# Patient Record
Sex: Female | Born: 1990 | Hispanic: Yes | Marital: Single
Health system: Northeastern US, Academic
[De-identification: ages and names within clinical notes are randomized; demographics above are authoritative.]

## PROBLEM LIST (undated history)

## (undated) DIAGNOSIS — E119 Type 2 diabetes mellitus without complications: Secondary | ICD-10-CM

## (undated) DIAGNOSIS — T148XXA Other injury of unspecified body region, initial encounter: Secondary | ICD-10-CM

## (undated) HISTORY — PX: SKIN GRAFT: SHX250

## (undated) HISTORY — PX: FEMUR SURGERY: SHX943

---

## 2017-05-15 ENCOUNTER — Encounter (HOSPITAL_COMMUNITY): Payer: Self-pay

## 2017-05-15 ENCOUNTER — Emergency Department (HOSPITAL_COMMUNITY)
Admission: EM | Admit: 2017-05-15 | Discharge: 2017-05-15 | Disposition: A | Discharge status: Home / Self Care | Payer: Medicare Other | Attending: Emergency Medicine | Admitting: Emergency Medicine

## 2017-05-15 DIAGNOSIS — N730 Acute parametritis and pelvic cellulitis: Secondary | ICD-10-CM | POA: Insufficient documentation

## 2017-05-15 DIAGNOSIS — N898 Other specified noninflammatory disorders of vagina: Secondary | ICD-10-CM | POA: Insufficient documentation

## 2017-05-15 DIAGNOSIS — E119 Type 2 diabetes mellitus without complications: Secondary | ICD-10-CM | POA: Insufficient documentation

## 2017-05-15 DIAGNOSIS — R102 Pelvic and perineal pain: Secondary | ICD-10-CM | POA: Diagnosis present

## 2017-05-15 HISTORY — DX: Type 2 diabetes mellitus without complications: E11.9

## 2017-05-15 HISTORY — DX: Other injury of unspecified body region, initial encounter: T14.8XXA

## 2017-05-15 LAB — CBC WITH DIFFERENTIAL/PLATELET
BASOS ABS: 0 10*3/uL (ref 0.0–0.1)
Basophils Relative: 0 %
Eosinophils Absolute: 0 10*3/uL (ref 0.0–0.7)
Eosinophils Relative: 0 %
HEMATOCRIT: 38.1 % (ref 36.0–46.0)
Hemoglobin: 12.8 g/dL (ref 12.0–15.0)
LYMPHS PCT: 16 %
Lymphs Abs: 1.4 10*3/uL (ref 0.7–4.0)
MCH: 29.4 pg (ref 26.0–34.0)
MCHC: 33.6 g/dL (ref 30.0–36.0)
MCV: 87.6 fL (ref 78.0–100.0)
MONO ABS: 0.5 10*3/uL (ref 0.1–1.0)
MONOS PCT: 5 %
NEUTROS ABS: 6.8 10*3/uL (ref 1.7–7.7)
Neutrophils Relative %: 79 %
Platelets: 241 10*3/uL (ref 150–400)
RBC: 4.35 MIL/uL (ref 3.87–5.11)
RDW: 12.9 % (ref 11.5–15.5)
WBC: 8.7 10*3/uL (ref 4.0–10.5)

## 2017-05-15 LAB — WET PREP, GENITAL
Sperm: NONE SEEN
Trich, Wet Prep: NONE SEEN
YEAST WET PREP: NONE SEEN

## 2017-05-15 LAB — URINALYSIS, ROUTINE W REFLEX MICROSCOPIC
BACTERIA UA: NONE SEEN
BILIRUBIN URINE: NEGATIVE
Glucose, UA: NEGATIVE mg/dL
KETONES UR: NEGATIVE mg/dL
Nitrite: NEGATIVE
PROTEIN: NEGATIVE mg/dL
Specific Gravity, Urine: 1.018 (ref 1.005–1.030)
pH: 5 (ref 5.0–8.0)

## 2017-05-15 LAB — PREGNANCY, URINE: PREG TEST UR: NEGATIVE

## 2017-05-15 MED ORDER — AZITHROMYCIN 250 MG PO TABS
1000.0000 mg | ORAL_TABLET | Freq: Once | ORAL | Status: AC
  Administered 2017-05-15: 1000 mg via ORAL
  Filled 2017-05-15: qty 4

## 2017-05-15 MED ORDER — CEFTRIAXONE SODIUM 1 G IJ SOLR
1.0000 g | Freq: Once | INTRAMUSCULAR | Status: AC
  Administered 2017-05-15: 1 g via INTRAMUSCULAR
  Filled 2017-05-15: qty 10

## 2017-05-15 MED ORDER — STERILE WATER FOR INJECTION IJ SOLN
INTRAMUSCULAR | Status: AC
  Filled 2017-05-15: qty 10

## 2017-05-15 MED ORDER — NAPROXEN 500 MG PO TABS: 500.0000 mg | ORAL_TABLET | Freq: Two times a day (BID) | ORAL | 0 refills | Status: AC

## 2017-05-15 MED ORDER — METRONIDAZOLE 500 MG PO TABS: 500.0000 mg | ORAL_TABLET | Freq: Two times a day (BID) | ORAL | 0 refills | Status: AC

## 2017-05-15 NOTE — ED Provider Notes (Signed)
[facility]-EMERGENCY DEPT Provider Note   CSN: [id number] Arrival date & time: 05/15/17  1819   By signing my name below, I, Kristina Tate, attest that this documentation has been prepared under the direction and in the presence of Kerrie BuffaloHope Laiklyn Pilkenton, NP Electronically Signed: Soijett Tate, ED Scribe. 05/15/17. 8:43 PM.  History   Chief Complaint Chief Complaint  Patient presents with  . Pelvic Pain    HPI Kristina Tate is a 26 y.o. female with a PMHx of DM, who presents to the Emergency Department complaining of pelvic pain onset today. Pt reports associated yellow vaginal discharge, chills, nausea, and loose stools. Pt has not tried any medications for the relief of her symptoms. She notes that she had "rough sex" 5 days ago prior to the onset of her symptoms. Patient's last menstrual period was 05/08/2017 which lasted for ~4 days. She notes that following the completion of her menstrual cycle, she had sexual intercourse with vaginal bleeding noted afterwards. She notes that she has one sexual partner of 2 months with whom she has unprotected sex. Pt denies her sexual partner complaining of any genital symptoms. She reports that she has had 3 miscarriages with 1 living child. She denies fever, vomiting, dysuria, urinary frequency, and any other symptoms. Pt is allergic to morphine and penicillin. Pt states that she doesn't take any medications for her DM and is diet-controlled at this time.       The history is provided by the patient. No language interpreter was used.    Past Medical History:  Diagnosis Date  . Diabetes mellitus without complication (HCC)   . Nerve damage     There are no active problems to display for this patient.   Past Surgical History:  Procedure Laterality Date  . FEMUR SURGERY    . SKIN GRAFT      OB History    No data available       Home Medications    Prior to Admission medications   Medication Sig Start Date End Date Taking? Authorizing Provider    metroNIDAZOLE (FLAGYL) 500 MG tablet Take 1 tablet (500 mg total) by mouth 2 (two) times daily. 05/15/17   Janne NapoleonNeese, Kawanda Drumheller M, NP    Family History No family history on file.  Social History Social History  Substance Use Topics  . Smoking status: Never Smoker  . Smokeless tobacco: Never Used  . Alcohol use Yes     Allergies   Morphine and related and Penicillins   Review of Systems Review of Systems  Constitutional: Positive for chills. Negative for fever.  Gastrointestinal: Positive for nausea. Negative for vomiting.  Genitourinary: Positive for pelvic pain and vaginal discharge (yellow). Negative for dysuria and frequency.     Physical Exam Updated Vital Signs BP 117/60 (BP Location: Left Arm)   Pulse 89   Temp 100.1 F (37.8 C) (Oral)   Resp 18   LMP 05/08/2017   SpO2 100%   Physical Exam  Constitutional: No distress.  obese  HENT:  Head: Normocephalic and atraumatic.  Eyes: EOM are normal.  Neck: Neck supple.  Cardiovascular: Normal rate and regular rhythm.   Pulmonary/Chest: Effort normal and breath sounds normal.  Abdominal: Soft. Bowel sounds are normal. There is tenderness. There is no CVA tenderness.  Lower abdominal tender with palpation. No guarding or rebound. No CVA tenderness.   Genitourinary:  Genitourinary Comments: Nurse chaperone present for exam. External genitalia without lesions. Purulent yellow d/c vaginal vault, cervix inflamed, positive CMT, bilateral adnexal  tenderness, no mass palpated.   Musculoskeletal: Normal range of motion.  Neurological: She is alert.  Skin: Skin is warm and dry.  Psychiatric: She has a normal mood and affect. Her behavior is normal.  Nursing note and vitals reviewed.   ED Treatments / Results  DIAGNOSTIC STUDIES: Oxygen Saturation is 100% on RA, nl by my interpretation.    COORDINATION OF CARE: 7:27 PM Discussed treatment plan with pt at bedside and pt agreed to plan.   Labs (all labs ordered are listed,  but only abnormal results are displayed) Labs Reviewed  WET PREP, GENITAL - Abnormal; Notable for the following:       Result Value   Clue Cells Wet Prep HPF POC PRESENT (*)    WBC, Wet Prep HPF POC MANY (*)    All other components within normal limits  URINALYSIS, ROUTINE W REFLEX MICROSCOPIC - Abnormal; Notable for the following:    Hgb urine dipstick MODERATE (*)    Leukocytes, UA TRACE (*)    Squamous Epithelial / LPF 0-5 (*)    All other components within normal limits  PREGNANCY, URINE  CBC WITH DIFFERENTIAL/PLATELET  RPR  HIV ANTIBODY (ROUTINE TESTING)  GC/CHLAMYDIA PROBE AMP ([place]) NOT AT [facility]   Radiology No results found.  Procedures Procedures (including critical care time)  Medications Ordered in ED Medications  cefTRIAXone (ROCEPHIN) injection 1 g (not administered)  azithromycin (ZITHROMAX) tablet 1,000 mg (not administered)     Initial Impression / Assessment and Plan / ED Course  I have reviewed the triage vital signs and the nursing notes.  Pertinent lab results that were available during my care of the patient were reviewed by me and considered in my medical decision making (see chart for details).   Final Clinical Impressions(s) / ED Diagnoses  26 y.o. female with pelvic pain, low grade fever and vaginal d/c. Patient's partner here with penile d/c and dysuria. Patient has normal CBC, no concern at this time for appendicitis. She does not have a surgical abdomen. Will treat for PID and she is to f/u with the health department. Rocephin 1 gram IM, Zithromax 1 gram PO and Rx Flagyl. Discussed with the patient findings and plan of care. She voices understanding.  Final diagnoses:  PID (acute pelvic inflammatory disease)    New Prescriptions New Prescriptions   METRONIDAZOLE (FLAGYL) 500 MG TABLET    Take 1 tablet (500 mg total) by mouth 2 (two) times daily.   I personally performed the services described in this documentation, which was scribed in  my presence. The recorded information has been reviewed and is accurate.    Kerrie Buffalo [place], [place] 05/15/17 2051    Geoffery Lyons, MD 05/15/17 2358

## 2017-05-15 NOTE — Discharge Instructions (Signed)
No sex for 2 weeks. Follow up with the health department for further testing.
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
## 2017-05-15 NOTE — ED Notes (Signed)
Pt understood dc material. NAD noted. Scripts given at dc 

## 2017-05-16 LAB — RPR: RPR Ser Ql: NONREACTIVE

## 2017-05-16 LAB — HIV ANTIBODY (ROUTINE TESTING W REFLEX): HIV Screen 4th Generation wRfx: NONREACTIVE

## 2017-05-18 LAB — GC/CHLAMYDIA PROBE AMP (~~LOC~~) NOT AT ARMC
CHLAMYDIA, DNA PROBE: NEGATIVE
NEISSERIA GONORRHEA: POSITIVE — AB

## 2018-01-11 ENCOUNTER — Emergency Department (HOSPITAL_COMMUNITY)
Admission: EM | Admit: 2018-01-11 | Discharge: 2018-01-11 | Disposition: A | Discharge status: Home / Self Care | Payer: Medicare Other | Attending: Emergency Medicine | Admitting: Emergency Medicine

## 2018-01-11 ENCOUNTER — Emergency Department (HOSPITAL_COMMUNITY): Payer: Medicare Other

## 2018-01-11 ENCOUNTER — Encounter (HOSPITAL_COMMUNITY): Payer: Self-pay

## 2018-01-11 ENCOUNTER — Other Ambulatory Visit: Payer: Self-pay

## 2018-01-11 DIAGNOSIS — Z79899 Other long term (current) drug therapy: Secondary | ICD-10-CM | POA: Insufficient documentation

## 2018-01-11 DIAGNOSIS — E119 Type 2 diabetes mellitus without complications: Secondary | ICD-10-CM | POA: Insufficient documentation

## 2018-01-11 DIAGNOSIS — M25561 Pain in right knee: Secondary | ICD-10-CM

## 2018-01-11 DIAGNOSIS — M25562 Pain in left knee: Secondary | ICD-10-CM | POA: Insufficient documentation
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
## 2018-01-11 NOTE — ED Provider Notes (Signed)
[facility] EMERGENCY DEPARTMENT Provider Note   CSN: [id number] Arrival date & time: 01/11/18  1434     History   Chief Complaint Chief Complaint  Patient presents with  . Knee Pain    HPI Kristina Tate is a 27 y.o. female.  HPI   27 year old female presents today with complaints of left knee pain.  Patient has a significant past medical history of trauma to her left knee.  She reports she has hardware in the left knee.  She notes that occasionally she has flares of pain in both the hip and the knee.  She notes that yesterday started having pain left hip and left knee.  She reports she is unable to extend the left knee typical of previous episodes.  She is able to ambulate.  She denies any redness warmth or fever.  Denies any neurological deficits.  Patient reports she does not like taking medication for pain and did not take anything prior today.   Past Medical History:  Diagnosis Date  . Diabetes mellitus without complication (HCC)   . Nerve damage     There are no active problems to display for this patient.   Past Surgical History:  Procedure Laterality Date  . FEMUR SURGERY    . SKIN GRAFT      OB History    No data available       Home Medications    Prior to Admission medications   Medication Sig Start Date End Date Taking? Authorizing Provider  metroNIDAZOLE (FLAGYL) 500 MG tablet Take 1 tablet (500 mg total) by mouth 2 (two) times daily. 05/15/17   Janne Napoleon, NP  naproxen (NAPROSYN) 500 MG tablet Take 1 tablet (500 mg total) by mouth 2 (two) times daily. 05/15/17   Janne Napoleon, NP    Family History No family history on file.  Social History Social History   Tobacco Use  . Smoking status: Never Smoker  . Smokeless tobacco: Never Used  Substance Use Topics  . Alcohol use: Yes  . Drug use: Yes     Allergies   Morphine and related and Penicillins   Review of Systems Review of Systems  All other systems reviewed  and are negative.   Physical Exam Updated Vital Signs BP 99/68 (BP Location: Right Arm)   Pulse 78   Temp 99 F (37.2 C) (Oral)   Resp 19   Wt 95.3 kg (210 lb)   SpO2 99%   Physical Exam  Constitutional: She is oriented to person, place, and time. She appears well-developed and well-nourished.  HENT:  Head: Normocephalic and atraumatic.  Eyes: Conjunctivae are normal. Pupils are equal, round, and reactive to light. Right eye exhibits no discharge. Left eye exhibits no discharge. No scleral icterus.  Neck: Normal range of motion. No JVD present. No tracheal deviation present.  Pulmonary/Chest: Effort normal. No stridor.  Musculoskeletal:  Left knee slightly flexed no warmth or redness-nontender to palpation.  Pain with extension  Neurological: She is alert and oriented to person, place, and time. Coordination normal.  Psychiatric: She has a normal mood and affect. Her behavior is normal. Judgment and thought content normal.  Nursing note and vitals reviewed.   ED Treatments / Results  Labs (all labs ordered are listed, but only abnormal results are displayed) Labs Reviewed - No data to display  EKG  EKG Interpretation None       Radiology Dg Knee Complete 4 Views Left  Result Date: 01/11/2018  CLINICAL DATA:  Progressive knee pain over the last 24 hours. No recent traumatic injury. EXAM: LEFT KNEE - COMPLETE 4+ VIEW COMPARISON:  None. FINDINGS: Distal tibial IM nail with interlocking screws noted. Small posterior superficial vascular clips along the calf. Lucency associated with the tip of the IM nail extends about 1.5 cm past the tip of the nail. No appreciable fracture. The lateral view attempt is off axis reducing sensitivity for knee effusion, but no definite effusion is seen. Minimal prepatellar subcutaneous edema. IMPRESSION: 1. No specific abnormality to explain the patient's knee pain. If symptoms are refractory to conservative therapy, further imaging by MRI or CT may  be warranted. 2. There is some lucency around the tip of the distal femoral IM nail, but this may well be chronic and related to original placement as I do not see lucency tracking around the margins of the distal IM nail to further suggest loosening or infection. Electronically Signed   By: Gaylyn RongWalter  Liebkemann M.D.   On: 01/11/2018 17:44    Procedures Procedures (including critical care time)  Medications Ordered in ED Medications - No data to display   Initial Impression / Assessment and Plan / ED Course  I have reviewed the triage vital signs and the nursing notes.  Pertinent labs & imaging results that were available during my care of the patient were reviewed by me and considered in my medical decision making (see chart for details).      Final Clinical Impressions(s) / ED Diagnoses   Final diagnoses:  Acute pain of right knee    Labs:   Imaging: DG knee complete left  Consults:  Therapeutics:  Discharge Meds:   Assessment/Plan: 27 year old female presents today with pain in her left knee typical of pain flares.  No signs of infection, no significant changes on her plain films of her knee.  Patient encouraged to rest, use ibuprofen or Tylenol, follow-up with orthopedics.  Return precautions given.  She verbalized understanding and agreement to today's plan.   ED Discharge Orders    None       Rosalio LoudHedges, Hannibal Skalla, PA-C 01/11/18 1807    Azalia Bilisampos, Kevin, MD 01/11/18 [phone]

## 2018-01-11 NOTE — Discharge Instructions (Signed)
Please read attached information. If you experience any new or worsening signs or symptoms please return to the emergency room for evaluation. Please follow-up with your primary care provider or specialist as discussed.  °

## 2018-06-25 IMAGING — DX DG KNEE COMPLETE 4+V*L*
4 series · 4 of 4 positions shown · non-contrast
Comparison: None.

CLINICAL DATA: Progressive knee pain over the last 24 hours. No
recent traumatic injury.

EXAM:
LEFT KNEE - COMPLETE 4+ VIEW

[knee ap]
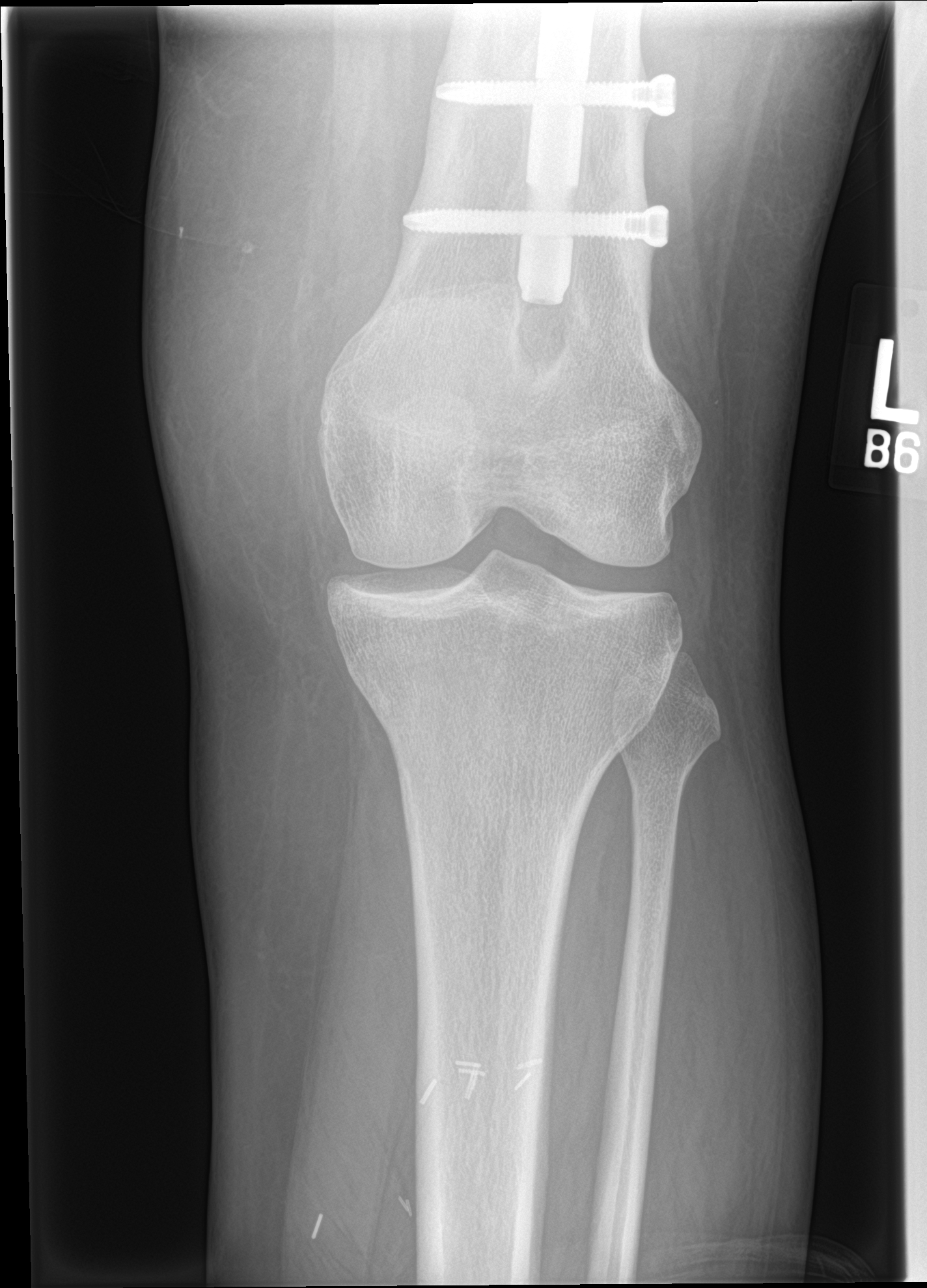

[knee lat]
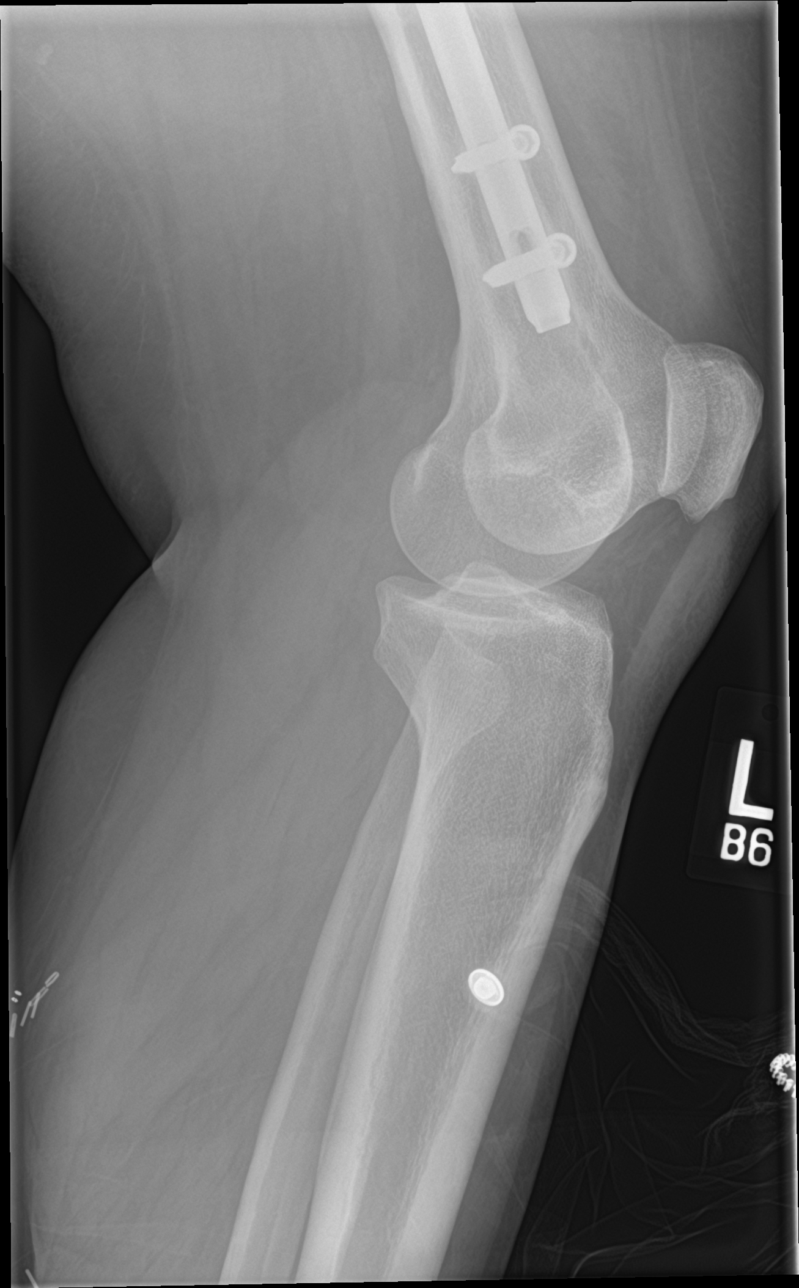

[knee obl (1 of 2)]
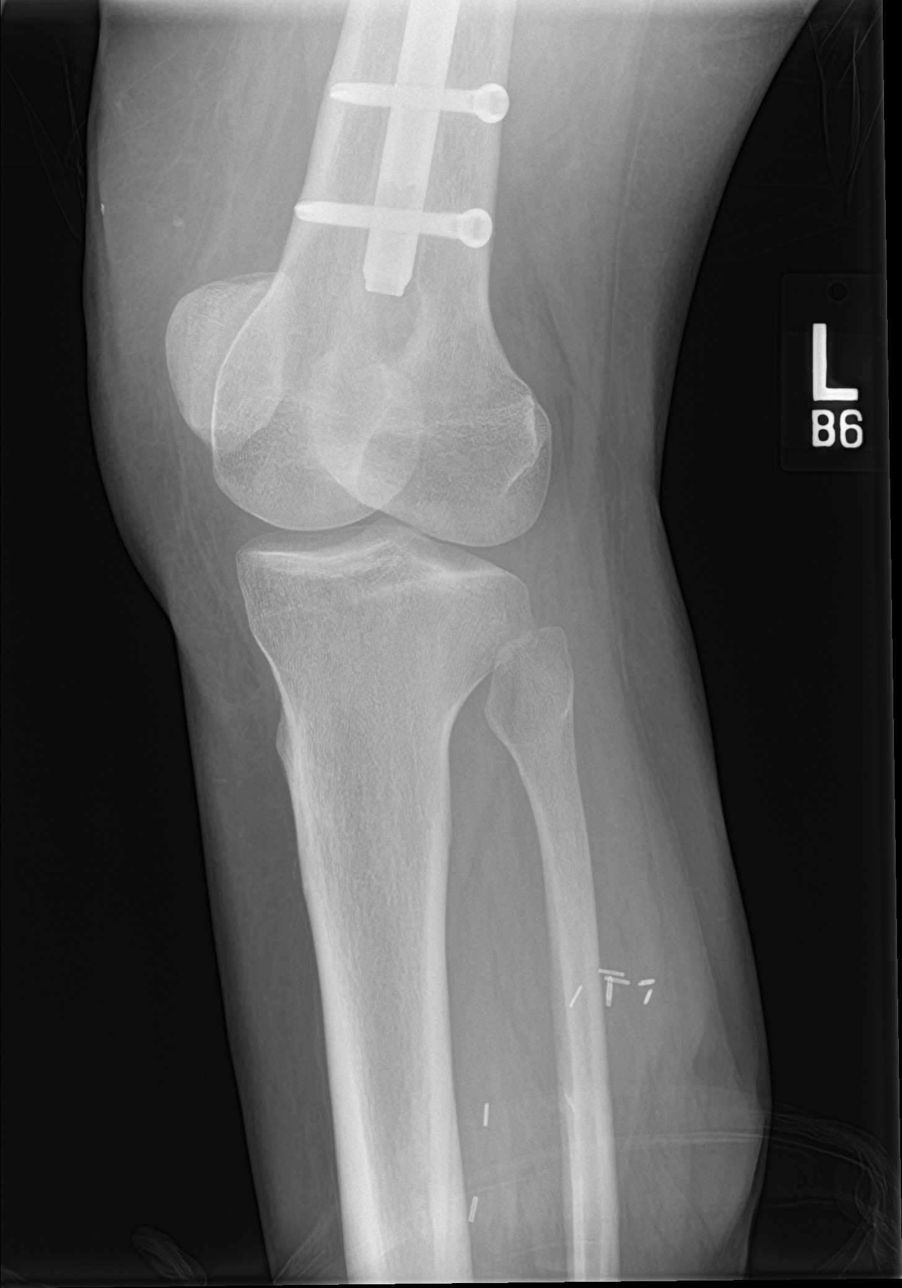

[knee obl (2 of 2)]
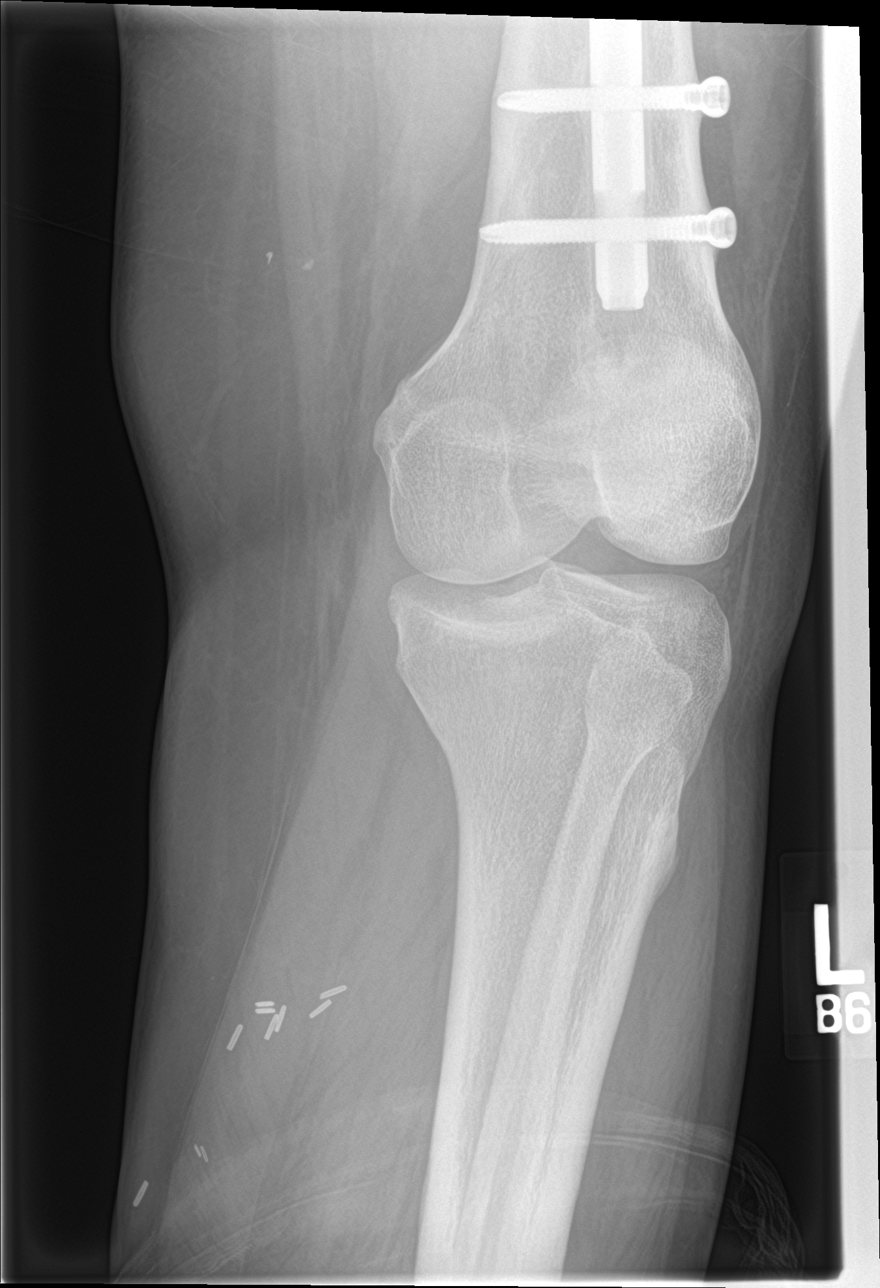

[4 of 4 positions shown; findings below may reference images not displayed]

FINDINGS: Distal tibial IM nail with interlocking screws noted. Small
posterior superficial vascular clips along the calf.

Lucency associated with the tip of the IM nail extends about 1.5 cm
past the tip of the nail.

No appreciable fracture. The lateral view attempt is off axis
reducing sensitivity for knee effusion, but no definite effusion is
seen. Minimal prepatellar subcutaneous edema.
IMPRESSION: 1. No specific abnormality to explain the patient's knee pain. If
symptoms are refractory to conservative therapy, further imaging by
MRI or CT may be warranted.
2. There is some lucency around the tip of the distal femoral IM
nail, but this may well be chronic and related to original placement
as I do not see lucency tracking around the margins of the distal IM
nail to further suggest loosening or infection.

## 2019-12-15 ENCOUNTER — Encounter: Admit: 2019-12-15 | Payer: PRIVATE HEALTH INSURANCE | Attending: Family

## 2019-12-15 DIAGNOSIS — M2559 Pain in other joint: Secondary | ICD-10-CM

## 2019-12-20 ENCOUNTER — Ambulatory Visit: Admit: 2019-12-20 | Payer: MEDICAID | Attending: Neurology

## 2019-12-20 ENCOUNTER — Inpatient Hospital Stay: Admit: 2019-12-20 | Discharge: 2019-12-20 | Payer: MEDICAID

## 2019-12-20 ENCOUNTER — Encounter: Admit: 2019-12-20 | Payer: PRIVATE HEALTH INSURANCE | Attending: Neurology

## 2019-12-20 DIAGNOSIS — R413 Other amnesia: Secondary | ICD-10-CM

## 2019-12-20 DIAGNOSIS — Z87820 Personal history of traumatic brain injury: Secondary | ICD-10-CM

## 2019-12-20 DIAGNOSIS — M79672 Pain in left foot: Secondary | ICD-10-CM

## 2019-12-20 DIAGNOSIS — R209 Unspecified disturbances of skin sensation: Secondary | ICD-10-CM

## 2019-12-20 DIAGNOSIS — M79652 Pain in left thigh: Secondary | ICD-10-CM

## 2019-12-20 DIAGNOSIS — M25552 Pain in left hip: Secondary | ICD-10-CM

## 2019-12-20 DIAGNOSIS — M25572 Pain in left ankle and joints of left foot: Secondary | ICD-10-CM

## 2019-12-20 LAB — VITAMIN B12: BKR VITAMIN B12: 473 pg/mL (ref 232–1245)

## 2019-12-20 LAB — TSH: BKR THYROID STIMULATING HORMONE: 1.53 u[IU]/mL

## 2019-12-20 LAB — FOLATE: BKR FOLATE: 7.3 ng/mL

## 2019-12-20 MED ORDER — GABAPENTIN 300 MG CAPSULE
300 mg | ORAL_CAPSULE | Freq: Three times a day (TID) | ORAL | 3 refills | Status: AC
Start: 2019-12-20 — End: ?

## 2019-12-20 NOTE — Patient Instructions
-   please call orthopedic surgery to set up follow up for left hip pain- you will be referred to plastic surgery to address your concerns with the left foot- you will be referred to our Concussion Clinic to evaluate memory impairment
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
## 2019-12-23 ENCOUNTER — Encounter: Admit: 2019-12-23 | Payer: MEDICAID | Attending: Psychiatry

## 2020-01-12 ENCOUNTER — Encounter: Admit: 2020-01-12 | Payer: MEDICAID | Attending: Orthopedic Surgery

## 2020-01-12 ENCOUNTER — Encounter: Admit: 2020-01-12 | Payer: PRIVATE HEALTH INSURANCE | Attending: Orthopedic Surgery

## 2020-01-12 DIAGNOSIS — M7062 Trochanteric bursitis, left hip: Secondary | ICD-10-CM

## 2020-01-12 NOTE — Progress Notes
DOS: 2005 left femur fracture ORIF Damian Leavell), per patient - pelvis fracture treated non operatively and skin gradt over left ankle due to degloving injury. the patient presents with left hip pain, Atraumatic in nature. She is 15 years following a femoral shaft fracture, treated with Trochanteric entry femoral nail. I saw her two years ago, for several complaints.   Over the last few months she's had worsening hip discomfort, and she localizes the tenderness to her trochanters. She's tried Tylenol and Motr in and it has effects but she's worried about perhaps something more serious.Patient treats in [facility] system for other somatic complaints and involving the ankle wrist. She's a busy woman Working at least part time and also caring for her 50 year old child. X Rays from 2019. Sure, slightly prominent proximal interlock. And some Bony overgrowth. That most likely. Is the source of her discomfort. . Impression and plan. 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 There's no reason to think she has degenerative joint disease based on her history and previous films. I discussed with her conservative measures. And the possibility of adding some therapy to this area given her current utilization. I reassured her that I saw nothing that would suggest a worsening problem that needs active treatment. If these conservative measures don't improve and her current caregivers cannot address the hip issue I will see her face to face, to confirm the diagnosis and advance treatment. We should get new imaging of hip at that timeVIDEO TELEHEALTH VISIT: This clinician is part of the telehealth program and is conducting this visit in a currently approved location. For this visit the clinician and patient were present via interactive audio & video telecommunications system that permits real-time communications.Patient/parent or guardian consent given for video visit: Yes [place] patient [place] located in: [place] clinician is appropriately licensed in the above state to provide care for this visit. Other individuals present during the telehealth encounter and their role/relation: noneIf billing based on time, please complete (Not required if billing based on MDM):                           Total time spent in medical video consultation: 35; Total time spent by the provider on the day of service, which includes time spent on chart review, medical video consultation, education, coordination of care/services and counseling Because this visit was completed over video, a hands-on physical exam was not performed.  Patient/parent or guardian understands and knows to call back if condition changes.

## 2020-01-12 NOTE — Progress Notes
Review of Systems Constitutional: Negative.  HENT: Negative.  Eyes: Negative.  Respiratory: Negative.  Cardiovascular: Negative.  Gastrointestinal: Negative.  Endocrine: Negative.  Genitourinary: Negative.  Musculoskeletal: Positive for gait problem.      Rt hip pain f/u Allergic/Immunologic: Negative.  Neurological: Positive for weakness and numbness. Hematological: Negative.  Psychiatric/Behavioral: Negative.  All other systems reviewed and are negative.

## 2020-02-01 NOTE — Progress Notes
Faxed PT order to [facility] in [place].

## 2020-02-04 ENCOUNTER — Ambulatory Visit: Admit: 2020-02-04 | Payer: PRIVATE HEALTH INSURANCE

## 2020-02-15 ENCOUNTER — Ambulatory Visit: Admit: 2020-02-15 | Payer: PRIVATE HEALTH INSURANCE | Attending: Surgery

## 2020-03-01 ENCOUNTER — Ambulatory Visit: Admit: 2020-03-01 | Payer: PRIVATE HEALTH INSURANCE | Attending: Rheumatology

## 2020-03-09 ENCOUNTER — Encounter: Admit: 2020-03-09 | Payer: PRIVATE HEALTH INSURANCE | Attending: Neurology
